# Patient Record
Sex: Male | Born: 1970 | Race: White | Hispanic: No | Marital: Married | Smoking: Never smoker
Health system: Southern US, Community
[De-identification: ages and names within clinical notes are randomized; demographics above are authoritative.]

## PROBLEM LIST (undated history)

## (undated) DIAGNOSIS — E7800 Pure hypercholesterolemia, unspecified: Secondary | ICD-10-CM

## (undated) HISTORY — PX: APPENDECTOMY: SHX54

---

## 2016-12-19 ENCOUNTER — Inpatient Hospital Stay (HOSPITAL_COMMUNITY)
Admission: EM | Admit: 2016-12-19 | Discharge: 2016-12-22 | DRG: 390 | Disposition: A | Discharge status: Home / Self Care | Payer: Managed Care, Other (non HMO) | Attending: Surgery | Admitting: Surgery

## 2016-12-19 ENCOUNTER — Encounter (HOSPITAL_COMMUNITY): Payer: Self-pay | Admitting: Emergency Medicine

## 2016-12-19 ENCOUNTER — Emergency Department (HOSPITAL_COMMUNITY): Payer: Managed Care, Other (non HMO)

## 2016-12-19 DIAGNOSIS — Z9049 Acquired absence of other specified parts of digestive tract: Secondary | ICD-10-CM

## 2016-12-19 DIAGNOSIS — K565 Intestinal adhesions [bands], unspecified as to partial versus complete obstruction: Secondary | ICD-10-CM | POA: Diagnosis not present

## 2016-12-19 DIAGNOSIS — K56609 Unspecified intestinal obstruction, unspecified as to partial versus complete obstruction: Secondary | ICD-10-CM

## 2016-12-19 DIAGNOSIS — Z0189 Encounter for other specified special examinations: Secondary | ICD-10-CM

## 2016-12-19 HISTORY — DX: Pure hypercholesterolemia, unspecified: E78.00

## 2016-12-19 LAB — COMPREHENSIVE METABOLIC PANEL
ALK PHOS: 68 U/L (ref 38–126)
ALT: 18 U/L (ref 17–63)
AST: 26 U/L (ref 15–41)
Albumin: 4.2 g/dL (ref 3.5–5.0)
Anion gap: 11 (ref 5–15)
BILIRUBIN TOTAL: 0.8 mg/dL (ref 0.3–1.2)
BUN: 16 mg/dL (ref 6–20)
CALCIUM: 8.8 mg/dL — AB (ref 8.9–10.3)
CO2: 22 mmol/L (ref 22–32)
CREATININE: 1.01 mg/dL (ref 0.61–1.24)
Chloride: 106 mmol/L (ref 101–111)
GFR calc non Af Amer: >60 mL/min (ref >60)
GLUCOSE: 174 mg/dL — AB (ref 65–99)
Potassium: 4.1 mmol/L (ref 3.5–5.1)
SODIUM: 139 mmol/L (ref 135–145)
TOTAL PROTEIN: 7.1 g/dL (ref 6.5–8.1)

## 2016-12-19 LAB — CBC
HCT: 39.8 % (ref 39.0–52.0)
Hemoglobin: 14.7 g/dL (ref 13.0–17.0)
MCH: 30.7 pg (ref 26.0–34.0)
MCHC: 36.9 g/dL — AB (ref 30.0–36.0)
MCV: 83.1 fL (ref 78.0–100.0)
PLATELETS: 186 10*3/uL (ref 150–400)
RBC: 4.79 MIL/uL (ref 4.22–5.81)
RDW: 12.4 % (ref 11.5–15.5)
WBC: 12.2 10*3/uL — ABNORMAL HIGH (ref 4.0–10.5)

## 2016-12-19 LAB — URINALYSIS, ROUTINE W REFLEX MICROSCOPIC
BILIRUBIN URINE: NEGATIVE
Glucose, UA: 50 mg/dL — AB
HGB URINE DIPSTICK: NEGATIVE
Ketones, ur: 20 mg/dL — AB
Leukocytes, UA: NEGATIVE
Nitrite: NEGATIVE
PROTEIN: NEGATIVE mg/dL
Specific Gravity, Urine: 1.036 — ABNORMAL HIGH (ref 1.005–1.030)
pH: 7 (ref 5.0–8.0)

## 2016-12-19 LAB — I-STAT CG4 LACTIC ACID, ED: LACTIC ACID, VENOUS: 2.22 mmol/L — AB (ref 0.5–1.9)

## 2016-12-19 LAB — LIPASE, BLOOD: Lipase: 27 U/L (ref 11–51)

## 2016-12-19 MED ORDER — HYDROMORPHONE HCL 1 MG/ML IJ SOLN
1.0000 mg | Freq: Once | INTRAMUSCULAR | Status: AC
  Administered 2016-12-19: 1 mg via INTRAVENOUS
  Filled 2016-12-19: qty 1

## 2016-12-19 MED ORDER — HEPARIN SODIUM (PORCINE) 5000 UNIT/ML IJ SOLN
5000.0000 [IU] | Freq: Three times a day (TID) | INTRAMUSCULAR | Status: DC
Start: 2016-12-19 — End: 2016-12-22
  Administered 2016-12-19 – 2016-12-22 (×8): 5000 [IU] via SUBCUTANEOUS
  Filled 2016-12-19 (×8): qty 1

## 2016-12-19 MED ORDER — SODIUM CHLORIDE 0.9 % IV BOLUS (SEPSIS)
1000.0000 mL | Freq: Once | INTRAVENOUS | Status: AC
  Administered 2016-12-19: 1000 mL via INTRAVENOUS

## 2016-12-19 MED ORDER — MORPHINE SULFATE (PF) 2 MG/ML IV SOLN
1.0000 mg | INTRAVENOUS | Status: DC | PRN
  Administered 2016-12-20: 1 mg via INTRAVENOUS
  Filled 2016-12-19: qty 1

## 2016-12-19 MED ORDER — ONDANSETRON 4 MG PO TBDP: 4.0000 mg | ORAL_TABLET | Freq: Four times a day (QID) | ORAL | Status: DC | PRN

## 2016-12-19 MED ORDER — HYDRALAZINE HCL 20 MG/ML IJ SOLN: 10.0000 mg | INTRAMUSCULAR | Status: DC | PRN

## 2016-12-19 MED ORDER — ONDANSETRON HCL 4 MG/2ML IJ SOLN
4.0000 mg | Freq: Four times a day (QID) | INTRAMUSCULAR | Status: DC | PRN
  Administered 2016-12-19 – 2016-12-20 (×2): 4 mg via INTRAVENOUS
  Filled 2016-12-19 (×2): qty 2

## 2016-12-19 MED ORDER — DIPHENHYDRAMINE HCL 25 MG PO CAPS: 25.0000 mg | ORAL_CAPSULE | Freq: Four times a day (QID) | ORAL | Status: DC | PRN

## 2016-12-19 MED ORDER — MORPHINE SULFATE (PF) 4 MG/ML IV SOLN
8.0000 mg | Freq: Once | INTRAVENOUS | Status: AC
  Administered 2016-12-19: 8 mg via INTRAVENOUS
  Filled 2016-12-19: qty 2

## 2016-12-19 MED ORDER — ONDANSETRON HCL 4 MG/2ML IJ SOLN
4.0000 mg | Freq: Once | INTRAMUSCULAR | Status: AC
  Administered 2016-12-19: 4 mg via INTRAVENOUS
  Filled 2016-12-19: qty 2

## 2016-12-19 MED ORDER — PANTOPRAZOLE SODIUM 40 MG IV SOLR
40.0000 mg | Freq: Every day | INTRAVENOUS | Status: DC
  Administered 2016-12-19 – 2016-12-21 (×3): 40 mg via INTRAVENOUS
  Filled 2016-12-19 (×3): qty 40

## 2016-12-19 MED ORDER — IOPAMIDOL (ISOVUE-300) INJECTION 61%
100.0000 mL | Freq: Once | INTRAVENOUS | Status: AC | PRN
  Administered 2016-12-19: 100 mL via INTRAVENOUS

## 2016-12-19 MED ORDER — KCL IN DEXTROSE-NACL 20-5-0.45 MEQ/L-%-% IV SOLN
INTRAVENOUS | Status: DC
  Administered 2016-12-19: 1000 mL via INTRAVENOUS
  Administered 2016-12-20 – 2016-12-21 (×3): via INTRAVENOUS
  Filled 2016-12-19 (×6): qty 1000

## 2016-12-19 MED ORDER — IOPAMIDOL (ISOVUE-300) INJECTION 61%
INTRAVENOUS | Status: AC
  Filled 2016-12-19: qty 100

## 2016-12-19 MED ORDER — DIPHENHYDRAMINE HCL 50 MG/ML IJ SOLN: 25.0000 mg | Freq: Four times a day (QID) | INTRAMUSCULAR | Status: DC | PRN

## 2016-12-19 NOTE — ED Notes (Signed)
Attempted to call report to floor, nurse is tied up and will call back.

## 2016-12-19 NOTE — ED Notes (Signed)
Bed: WA15 Expected date:  Expected time:  Means of arrival:  Comments: EMS-abdominal pain 

## 2016-12-19 NOTE — ED Notes (Signed)
Patient's wife in hallway stating that patient is having severe pain. Explained to wife that waiting for EDP to give orders for patient.  Went to EDP office and made announcement to the ED providers in the office at this time, due to no provider currently assigned to patient, that patient LLQ pain is getting worse and requesting pain medications.   No new orders given at this time.

## 2016-12-19 NOTE — H&P (Signed)
Chief Complaint:  Acute onset of abdominal pain  History of Present Illness:  Ronald Frazier is an 46 y.o. male who was brought to the [facility] ER from work with sudden onset of abdominal pain and vomiting.  He lives in [place] and is followed by primary doc Eliezer Mccoy.  He works in [employer].  The pain would wax and wane and was severe and associated with sweats.  His only operation was an open appendectomy 40 years ago.    Presently he is sore in the abdomen.  He is feeling better  Past Medical History:  Diagnosis Date  . High cholesterol     Past Surgical History:  Procedure Laterality Date  . APPENDECTOMY      Current Facility-Administered Medications  Medication Dose Route Frequency Provider Last Rate Last Dose  . iopamidol (ISOVUE-300) 61 % injection           . morphine 4 MG/ML injection 8 mg  8 mg Intravenous Once Sherwood Gambler, MD       Current Outpatient Prescriptions  Medication Sig Dispense Refill  . fexofenadine (ALLEGRA) 30 MG tablet Take 30 mg by mouth daily as needed (allergies).    Marland Kitchen ibuprofen (ADVIL,MOTRIN) 200 MG tablet Take 200 mg by mouth every 6 (six) hours as needed for moderate pain.     Patient has no known allergies. History reviewed. No pertinent family history. Social History:   reports that he has never smoked. He has never used smokeless tobacco. He reports that he does not drink alcohol or use drugs.   REVIEW OF SYSTEMS : Negative except for a hx of recurrent left lower quadrant pain  Physical Exam:   Blood pressure (!) 139/91, pulse 60, temperature 97.7 F (36.5 C), temperature source Oral, resp. rate 18, height 6' (1.829 m), weight 95.3 kg (210 lb), SpO2 100 %. Body mass index is 28.48 kg/m.  Gen:  WDWN WM NAD  Neurological: Alert and oriented to person, place, and time. Motor and sensory function is grossly intact  Head: Normocephalic and atraumatic.  Eyes: Conjunctivae are normal. Pupils are equal, round, and reactive to light. No scleral  icterus.  Neck: Normal range of motion. Neck supple. No tracheal deviation or thyromegaly present.  Cardiovascular:  SR without murmurs or gallops.  No carotid bruits Breast:  Not examined Respiratory: Effort normal.  No respiratory distress. No chest wall tenderness. Breath sounds normal.  No wheezes, rales or rhonchi.  Abdomen:  Flat and hairy; soft with some tenderness in the left lower quadrant.  No peritoneal signs. Patient had been medicated in the recent past GU:  No hernia Musculoskeletal: Normal range of motion. Extremities are nontender. No cyanosis, edema or clubbing noted Lymphadenopathy: No cervical, preauricular, postauricular or axillary adenopathy is present Skin: Skin is warm and dry. No rash noted. No diaphoresis. No erythema. No pallor. Pscyh: Normal mood and affect. Behavior is normal. Judgment and thought content normal.   LABORATORY RESULTS: Results for orders placed or performed during the hospital encounter of 12/19/16 (from the past 48 hour(s))  Lipase, blood     Status: None   Collection Time: 12/19/16  5:35 PM  Result Value Ref Range   Lipase 27 11 - 51 U/L  Comprehensive metabolic panel     Status: Abnormal   Collection Time: 12/19/16  5:35 PM  Result Value Ref Range   Sodium 139 135 - 145 mmol/L   Potassium 4.1 3.5 - 5.1 mmol/L   Chloride 106 101 - 111 mmol/L  CO2 22 22 - 32 mmol/L   Glucose, Bld 174 (H) 65 - 99 mg/dL   BUN 16 6 - 20 mg/dL   Creatinine, Ser 1.01 0.61 - 1.24 mg/dL   Calcium 8.8 (L) 8.9 - 10.3 mg/dL   Total Protein 7.1 6.5 - 8.1 g/dL   Albumin 4.2 3.5 - 5.0 g/dL   AST 26 15 - 41 U/L   ALT 18 17 - 63 U/L   Alkaline Phosphatase 68 38 - 126 U/L   Total Bilirubin 0.8 0.3 - 1.2 mg/dL   GFR calc non Af Amer >60 >60 mL/min   GFR calc Af Amer >60 >60 mL/min    Comment: (NOTE) The eGFR has been calculated using the CKD EPI equation. This calculation has not been validated in all clinical situations. eGFR's persistently <60 mL/min signify  possible Chronic Kidney Disease.    Anion gap 11 5 - 15  CBC     Status: Abnormal   Collection Time: 12/19/16  5:35 PM  Result Value Ref Range   WBC 12.2 (H) 4.0 - 10.5 K/uL   RBC 4.79 4.22 - 5.81 MIL/uL   Hemoglobin 14.7 13.0 - 17.0 g/dL   HCT 39.8 39.0 - 52.0 %   MCV 83.1 78.0 - 100.0 fL   MCH 30.7 26.0 - 34.0 pg   MCHC 36.9 (H) 30.0 - 36.0 g/dL   RDW 12.4 11.5 - 15.5 %   Platelets 186 150 - 400 K/uL  I-Stat CG4 Lactic Acid, ED     Status: Abnormal   Collection Time: 12/19/16  8:44 PM  Result Value Ref Range   Lactic Acid, Venous 2.22 (HH) 0.5 - 1.9 mmol/L   Comment NOTIFIED PHYSICIAN      RADIOLOGY RESULTS: Ct Abdomen Pelvis W Contrast  Result Date: 12/19/2016 CLINICAL DATA:  Left lower quadrant abdominal pain, nausea and vomiting. EXAM: CT ABDOMEN AND PELVIS WITH CONTRAST TECHNIQUE: Multidetector CT imaging of the abdomen and pelvis was performed using the standard protocol following bolus administration of intravenous contrast. CONTRAST:  173m ISOVUE-300 IOPAMIDOL (ISOVUE-300) INJECTION 61% COMPARISON:  None. FINDINGS: Lower chest: Tiny inferior right upper lobe calcified granuloma on image number 1 of series 3. There is also a tiny calcified granuloma in the medial aspect of the right lower lobe on image number 22 of series 3. Hepatobiliary: No focal liver abnormality is seen. No gallstones, gallbladder wall thickening, or biliary dilatation. Pancreas: Unremarkable. No pancreatic ductal dilatation or surrounding inflammatory changes. Spleen: Normal in size without focal abnormality. Adrenals/Urinary Tract: Prominent extrarenal pelvis on the left. Otherwise, normal appearing kidneys, ureters and urinary bladder. Normal appearing adrenal glands. Stomach/Bowel: Multiple dilated small bowel loops within and proximal to an area of swirling mesenteric with mesenteric edema. There are areas of focal narrowing of these loops with a pinched appearance. No bowel wall thickening or pneumatosis  demonstrated. No free peritoneal air. There is a small amount of free peritoneal fluid. Vascular/Lymphatic: Swirling mesenteric vessels and fat in the left lower abdomen in the area of dilated bowel loops. No areas of vascular occlusion are seen. No aneurysm. No enlarged lymph nodes. Reproductive: Mildly enlarged prostate gland with central calcification. Other: Small amount of free peritoneal fluid. Musculoskeletal: Mild lumbar and lower thoracic spine degenerative changes, greatest at the L4-5 level. IMPRESSION: Closed loop small-bowel obstruction in the left lower abdomen with mesenteric edema and swirling. This is compatible with an internal hernia or twisting of the mesentery. No evidence of intestinal ischemia at this time. There is an  associated small amount of free peritoneal fluid. These results were called by telephone at the time of interpretation on 12/19/2016 at 7:58 pm to Dr. Sherwood Gambler , who verbally acknowledged these results. Electronically Signed   By: Claudie Revering M.D.   On: 12/19/2016 20:00    Problem List: Patient Active Problem List   Diagnosis Date Noted  . Small bowel obstruction due to adhesions ([place]) 12/19/2016  . Hx of appendectomy 12/19/2016    Assessment & Plan: I reviewed the CT scan and there may be a mechanical twist in the left lower quadrant.  Presently he is feeling better.  Will admit for hydration and observation.  Repeat lab and xray in the am.  If recurrent vomiting, will place NG tube.      Matt B. Hassell Done, MD, [facility] [phone] beeper [phone]  12/19/2016 8:59 PM

## 2016-12-19 NOTE — ED Notes (Signed)
ED Provider at bedside. 

## 2016-12-19 NOTE — ED Provider Notes (Signed)
[facility] Provider Note   CSN: [id number] Arrival date & time: 12/19/16  1715     History   Chief Complaint Chief Complaint  Patient presents with  . Abdominal Pain  . Nausea  . Emesis    HPI Ronald Frazier is a 46 y.o. male.  HPI  46 year old male presents with severe abdominal pain. He states that around 1 PM he started having gradually progressive abdominal pain and vomiting. He has had one loose stool without blood. He estimates about 5 episodes of vomiting. None of these have had blood either. Abdominal pain is diffuse but it does feel like is probably worse in the left lower quadrant. It feels like a dull ache. He denies any recent antibiotics. No fevers. He wonders if this is food poisoning as this happened about an hour after lunch. He made himself appear butter and jelly sandwich. No sick contacts. No urinary symptoms.  Past Medical History:  Diagnosis Date  . High cholesterol     Patient Active Problem List   Diagnosis Date Noted  . Small bowel obstruction due to adhesions ([facility]) 12/19/2016  . Hx of appendectomy 12/19/2016    Past Surgical History:  Procedure Laterality Date  . APPENDECTOMY         Home Medications    Prior to Admission medications   Medication Sig Start Date End Date Taking? Authorizing Provider  fexofenadine (ALLEGRA) 30 MG tablet Take 30 mg by mouth daily as needed (allergies).   Yes [provider]  ibuprofen (ADVIL,MOTRIN) 200 MG tablet Take 200 mg by mouth every 6 (six) hours as needed for moderate pain.   Yes [provider]    Family History History reviewed. No pertinent family history.  Social History Social History  Substance Use Topics  . Smoking status: Never Smoker  . Smokeless tobacco: Never Used  . Alcohol use No     Allergies   Patient has no known allergies.   Review of Systems Review of Systems  Constitutional: Negative for fever.  Gastrointestinal: Positive for  abdominal pain, diarrhea, nausea and vomiting. Negative for blood in stool.  Genitourinary: Negative for dysuria and hematuria.  Musculoskeletal: Negative for back pain.  All other systems reviewed and are negative.    Physical Exam Updated Vital Signs BP 140/85 (BP Location: Right Arm)   Pulse 78   Temp 97.7 F (36.5 C) (Oral)   Resp 18   Ht 6' (1.829 m)   Wt 95.3 kg (210 lb)   SpO2 99%   BMI 28.48 kg/m   Physical Exam  Constitutional: He is oriented to person, place, and time. He appears well-developed and well-nourished.  HENT:  Head: Normocephalic and atraumatic.  Right Ear: External ear normal.  Left Ear: External ear normal.  Nose: Nose normal.  Mouth/Throat: Mucous membranes are dry.  Eyes: Right eye exhibits no discharge. Left eye exhibits no discharge.  Neck: Neck supple.  Cardiovascular: Normal rate, regular rhythm and normal heart sounds.   Pulmonary/Chest: Effort normal and breath sounds normal.  Abdominal: Soft. There is tenderness in the left upper quadrant and left lower quadrant. There is no rigidity.  Musculoskeletal: He exhibits no edema.  Neurological: He is alert and oriented to person, place, and time.  Skin: Skin is warm and dry.  Nursing note and vitals reviewed.    ED Treatments / Results  Labs (all labs ordered are listed, but only abnormal results are displayed) Labs Reviewed  COMPREHENSIVE METABOLIC PANEL - Abnormal; Notable for  the following:       Result Value   Glucose, Bld 174 (*)    Calcium 8.8 (*)    All other components within normal limits  CBC - Abnormal; Notable for the following:    WBC 12.2 (*)    MCHC 36.9 (*)    All other components within normal limits  URINALYSIS, ROUTINE W REFLEX MICROSCOPIC - Abnormal; Notable for the following:    Specific Gravity, Urine 1.036 (*)    Glucose, UA 50 (*)    Ketones, ur 20 (*)    All other components within normal limits  I-STAT CG4 LACTIC ACID, ED - Abnormal; Notable for the  following:    Lactic Acid, Venous 2.22 (*)    All other components within normal limits  LIPASE, BLOOD  HIV ANTIBODY (ROUTINE TESTING)  BASIC METABOLIC PANEL    EKG  EKG Interpretation None       Radiology Ct Abdomen Pelvis W Contrast  Result Date: 12/19/2016 CLINICAL DATA:  Left lower quadrant abdominal pain, nausea and vomiting. EXAM: CT ABDOMEN AND PELVIS WITH CONTRAST TECHNIQUE: Multidetector CT imaging of the abdomen and pelvis was performed using the standard protocol following bolus administration of intravenous contrast. CONTRAST:  ISOVUE-300 IOPAMIDOL (ISOVUE-300) INJECTION 61% COMPARISON:  None. FINDINGS: Lower chest: Tiny inferior right upper lobe calcified granuloma on image number 1 of series 3. There is also a tiny calcified granuloma in the medial aspect of the right lower lobe on image number 22 of series 3. Hepatobiliary: No focal liver abnormality is seen. No gallstones, gallbladder wall thickening, or biliary dilatation. Pancreas: Unremarkable. No pancreatic ductal dilatation or surrounding inflammatory changes. Spleen: Normal in size without focal abnormality. Adrenals/Urinary Tract: Prominent extrarenal pelvis on the left. Otherwise, normal appearing kidneys, ureters and urinary bladder. Normal appearing adrenal glands. Stomach/Bowel: Multiple dilated small bowel loops within and proximal to an area of swirling mesenteric with mesenteric edema. There are areas of focal narrowing of these loops with a pinched appearance. No bowel wall thickening or pneumatosis demonstrated. No free peritoneal air. There is a small amount of free peritoneal fluid. Vascular/Lymphatic: Swirling mesenteric vessels and fat in the left lower abdomen in the area of dilated bowel loops. No areas of vascular occlusion are seen. No aneurysm. No enlarged lymph nodes. Reproductive: Mildly enlarged prostate gland with central calcification. Other: Small amount of free peritoneal fluid. Musculoskeletal:  Mild lumbar and lower thoracic spine degenerative changes, greatest at the L4-5 level. IMPRESSION: Closed loop small-bowel obstruction in the left lower abdomen with mesenteric edema and swirling. This is compatible with an internal hernia or twisting of the mesentery. No evidence of intestinal ischemia at this time. There is an associated small amount of free peritoneal fluid. These results were called by telephone at the time of interpretation on 12/19/2016 at 7:58 pm to Dr. Pricilla Loveless , who verbally acknowledged these results. Electronically Signed   By: Beckie Salts M.D.   On: 12/19/2016 20:00    Procedures Procedures (including critical care time)  Medications Ordered in ED Medications  iopamidol (ISOVUE-300) 61 % injection (not administered)  heparin injection 5,000 Units (5,000 Units Subcutaneous Given 12/19/16 2321)  dextrose 5 % and 0.45 % NaCl with KCl 20 mEq/L infusion (1,000 mLs Intravenous New Bag/Given 12/19/16 2320)  morphine 2 MG/ML injection 1 mg (not administered)  diphenhydrAMINE (BENADRYL) capsule 25 mg (not administered)    Or  diphenhydrAMINE (BENADRYL) injection 25 mg (not administered)  ondansetron (ZOFRAN-ODT) disintegrating tablet 4 mg ( Oral  See Alternative 12/19/16 2322)    Or  ondansetron [facility](ZOFRAN) injection 4 mg (4 mg Intravenous Given 12/19/16 2322)  pantoprazole (PROTONIX) injection 40 mg (40 mg Intravenous Given 12/19/16 2321)  hydrALAZINE (APRESOLINE) injection 10 mg (not administered)  sodium chloride 0.9 % bolus 1,000 mL (0 mLs Intravenous Stopped 12/19/16 2059)  HYDROmorphone (DILAUDID) injection 1 mg (1 mg Intravenous Given 12/19/16 1844)  ondansetron (ZOFRAN) injection 4 mg (4 mg Intravenous Given 12/19/16 1844)  sodium chloride 0.9 % bolus 1,000 mL (0 mLs Intravenous Stopped 12/19/16 2100)  iopamidol (ISOVUE-300) 61 % injection 100 mL (100 mLs Intravenous Contrast Given 12/19/16 1931)  morphine 4 MG/ML injection 8 mg (8 mg Intravenous Given 12/19/16 2100)      Initial Impression / Assessment and Plan / ED Course  I have reviewed the triage vital signs and the nursing notes.  Pertinent labs & imaging results that were available during my care of the patient were reviewed by me and considered in my medical decision making (see chart for details).     Given degree of pain and tenderness, CT obtained, shows closed loop obstruction. Pain somewhat improved but still present. No vomiting in ED after zofran. Concern for possible internal hernia, thus surgery consulted. They will admit, fluids, npo. No NG at this time per Dr. Daphine DeutscherMartin.   Final Clinical Impressions(s) / ED Diagnoses   Final diagnoses:  Small bowel obstruction [facility](HCC)    New Prescriptions Current Discharge Medication List       Pricilla LovelessGoldston, Mela Perham, MD 12/20/16 [phone]

## 2016-12-19 NOTE — ED Notes (Signed)
Writer informed pt that a urine is needed for sample, urinal at bedside.

## 2016-12-19 NOTE — ED Triage Notes (Addendum)
Patient comes in with [facility] from work c/o LLQ pain with n/v and 1 soft stool that started gradually while patient was at work but denies diarrhea and urinary problems. .  Patient was given Zofran 4mg  and two doses of Fentanyl 50mcg and NS 500ml via IV in route.  Patient dry heaving upon starting triaging this patient. BP initially was 90/60, then systolic came up to 130s.

## 2016-12-20 ENCOUNTER — Observation Stay (HOSPITAL_COMMUNITY): Payer: Managed Care, Other (non HMO)

## 2016-12-20 ENCOUNTER — Inpatient Hospital Stay (HOSPITAL_COMMUNITY): Payer: Managed Care, Other (non HMO)

## 2016-12-20 DIAGNOSIS — Z9049 Acquired absence of other specified parts of digestive tract: Secondary | ICD-10-CM | POA: Diagnosis present

## 2016-12-20 DIAGNOSIS — K565 Intestinal adhesions [bands], unspecified as to partial versus complete obstruction: Secondary | ICD-10-CM | POA: Diagnosis present

## 2016-12-20 LAB — BASIC METABOLIC PANEL
Anion gap: 7 (ref 5–15)
BUN: 11 mg/dL (ref 6–20)
CALCIUM: 8.5 mg/dL — AB (ref 8.9–10.3)
CO2: 25 mmol/L (ref 22–32)
Chloride: 107 mmol/L (ref 101–111)
Creatinine, Ser: 0.86 mg/dL (ref 0.61–1.24)
GFR calc Af Amer: >60 mL/min (ref >60)
GLUCOSE: 128 mg/dL — AB (ref 65–99)
POTASSIUM: 4.2 mmol/L (ref 3.5–5.1)
SODIUM: 139 mmol/L (ref 135–145)

## 2016-12-20 LAB — CBC
HCT: 38.9 % — ABNORMAL LOW (ref 39.0–52.0)
Hemoglobin: 14 g/dL (ref 13.0–17.0)
MCH: 30.3 pg (ref 26.0–34.0)
MCHC: 36 g/dL (ref 30.0–36.0)
MCV: 84.2 fL (ref 78.0–100.0)
Platelets: 181 10*3/uL (ref 150–400)
RBC: 4.62 MIL/uL (ref 4.22–5.81)
RDW: 12.7 % (ref 11.5–15.5)
WBC: 10.3 10*3/uL (ref 4.0–10.5)

## 2016-12-20 LAB — LACTIC ACID, PLASMA: LACTIC ACID, VENOUS: 1.7 mmol/L (ref 0.5–1.9)

## 2016-12-20 MED ORDER — DIATRIZOATE MEGLUMINE & SODIUM 66-10 % PO SOLN
90.0000 mL | Freq: Once | ORAL | Status: AC
  Administered 2016-12-20: 90 mL via NASOGASTRIC
  Filled 2016-12-20: qty 90

## 2016-12-20 MED ORDER — PHENOL 1.4 % MT LIQD
1.0000 | OROMUCOSAL | Status: DC | PRN
  Filled 2016-12-20: qty 177

## 2016-12-20 MED ORDER — ACETAMINOPHEN 10 MG/ML IV SOLN
1000.0000 mg | Freq: Four times a day (QID) | INTRAVENOUS | Status: AC | PRN
  Administered 2016-12-20: 1000 mg via INTRAVENOUS
  Filled 2016-12-20: qty 100

## 2016-12-20 NOTE — Progress Notes (Signed)
[facility] Progress Note     Subjective: CC:  Pt reports abdominal pain yesterday, intermittent, severe, cramping, followed by multiple episodes of emesis. Denies similar pain in past. history of open appendectomy over 9 years ago. No person HX hernia or cancer, mother had GYN cancer, father had pancreatic cancer.  Abdominal pain improving but still mildly tender. Last episode vomiting around 9-10 PM. One episode flatus. No BM. Last BM yesterday AM and was loose. At baseline patient has one, formed stool daily.   Objective: Vital signs in last 24 hours: Temp:  [97.7 F (36.5 C)-98.2 F (36.8 C)] 98.2 F (36.8 C) (07/17 0451) Pulse Rate:  [60-83] 68 (07/17 0451) Resp:  [16-19] 16 (07/17 0451) BP: (115-140)/(64-91) 115/64 (07/17 0451) SpO2:  [93 %-100 %] 100 % (07/17 0451) Weight:  [95.3 kg (210 lb)] 95.3 kg (210 lb) (07/16 1727) Last BM Date: 12/19/16  Intake/Output from previous day: 07/16 0701 - 07/17 0700 In: 2666.7 [I.V.:666.7; IV Piggyback:2000] Out: 575 [Urine:575] Intake/Output this shift: Total I/O In: -  Out: 675 [Urine:675]  PE: Gen:  Alert, NAD, cooperative HEENT: pupils equal and round, EOM's in tact Card:  Regular rate and rhythm, pedal pulses 2+ BL Pulm:  Normal effort, clear to auscultation bilaterally Abd: Soft, TTP epigastrium and LLQ without rebound or guarding, mild distention, hypoactive BSSkin: warm and dry, no rashes  Psych: A&Ox3   Lab Results:   Recent Labs  12/19/16 1735  WBC 12.2*  HGB 14.7  HCT 39.8  PLT 186   BMET  Recent Labs  12/19/16 1735 12/20/16 0535  NA 139 139  K 4.1 4.2  CL 106 107  CO2 22 25  GLUCOSE 174* 128*  BUN 16 11  CREATININE 1.01 0.86  CALCIUM 8.8* 8.5*   PT/INR No results for input(s): LABPROT, INR in the last 72 hours. CMP     Component Value Date/Time   NA 139 12/20/2016 0535   K 4.2 12/20/2016 0535   CL 107 12/20/2016 0535   CO2 25 12/20/2016 0535   GLUCOSE 128 (H) 12/20/2016 0535    BUN 11 12/20/2016 0535   CREATININE 0.86 12/20/2016 0535   CALCIUM 8.5 (L) 12/20/2016 0535   PROT 7.1 12/19/2016 1735   ALBUMIN 4.2 12/19/2016 1735   AST 26 12/19/2016 1735   ALT 18 12/19/2016 1735   ALKPHOS 68 12/19/2016 1735   BILITOT 0.8 12/19/2016 1735   GFRNONAA >60 12/20/2016 0535   GFRAA >60 12/20/2016 0535   Lipase     Component Value Date/Time   LIPASE 27 12/19/2016 1735       Studies/Results: Ct Abdomen Pelvis W Contrast  Result Date: 12/19/2016 CLINICAL DATA:  Left lower quadrant abdominal pain, nausea and vomiting. EXAM: CT ABDOMEN AND PELVIS WITH CONTRAST TECHNIQUE: Multidetector CT imaging of the abdomen and pelvis was performed using the standard protocol following bolus administration of intravenous contrast. CONTRAST:  100mL ISOVUE-300 IOPAMIDOL (ISOVUE-300) INJECTION 61% COMPARISON:  None. FINDINGS: Lower chest: Tiny inferior right upper lobe calcified granuloma on image number 1 of series 3. There is also a tiny calcified granuloma in the medial aspect of the right lower lobe on image number 22 of series 3. Hepatobiliary: No focal liver abnormality is seen. No gallstones, gallbladder wall thickening, or biliary dilatation. Pancreas: Unremarkable. No pancreatic ductal dilatation or surrounding inflammatory changes. Spleen: Normal in size without focal abnormality. Adrenals/Urinary Tract: Prominent extrarenal pelvis on the left. Otherwise, normal appearing kidneys, ureters and urinary bladder. Normal appearing adrenal glands. Stomach/Bowel:  Multiple dilated small bowel loops within and proximal to an area of swirling mesenteric with mesenteric edema. There are areas of focal narrowing of these loops with a pinched appearance. No bowel wall thickening or pneumatosis demonstrated. No free peritoneal air. There is a small amount of free peritoneal fluid. Vascular/Lymphatic: Swirling mesenteric vessels and fat in the left lower abdomen in the area of dilated bowel loops. No areas  of vascular occlusion are seen. No aneurysm. No enlarged lymph nodes. Reproductive: Mildly enlarged prostate gland with central calcification. Other: Small amount of free peritoneal fluid. Musculoskeletal: Mild lumbar and lower thoracic spine degenerative changes, greatest at the L4-5 level. IMPRESSION: Closed loop small-bowel obstruction in the left lower abdomen with mesenteric edema and swirling. This is compatible with an internal hernia or twisting of the mesentery. No evidence of intestinal ischemia at this time. There is an associated small amount of free peritoneal fluid. These results were called by telephone at the time of interpretation on 12/19/2016 at 7:58 pm to Dr. Pricilla Loveless , who verbally acknowledged these results. Electronically Signed   By: Beckie Salts M.D.   On: 12/19/2016 20:00    Anti-infectives: Anti-infectives    None     Assessment/Plan SBO - Past medical history of open appendectomy - CT scan performed 7/16 suggests mesenteric swirling in the left lower quadrant with proximally dilated loops of small bowel, repeat AXR this AM with worsening small bowel distention in mid abdomen (5.3 cm) - lactate yesterday 2.2, WBC 12.2; repeat labs pending -  Place NG tube and start small bowel protocol.  FEN - nothing by mouth, IVF, electrolytes within normal limits ID- none; WBC 12.2 on 7/16 VT-Lovenox, SCDs   LOS: 0 days    Adam Phenix , [facility] 12/20/2016, 8:45 AM Pager: [phone] Consults: [phone] Mon-Fri 7:00 am-4:30 pm Sat-Sun 7:00 am-11:30 am

## 2016-12-20 NOTE — Progress Notes (Signed)
Dr. Gerrit FriendsGerkin aware via phone pt has had lingering headache throughout shift. Morphine ineffective. See new order received.

## 2016-12-21 LAB — HIV ANTIBODY (ROUTINE TESTING W REFLEX): HIV Screen 4th Generation wRfx: NONREACTIVE

## 2016-12-21 MED ORDER — POLYETHYLENE GLYCOL 3350 17 G PO PACK: 17.0000 g | PACK | Freq: Every day | ORAL | Status: DC | PRN

## 2016-12-21 MED ORDER — ACETAMINOPHEN 500 MG PO TABS: 500.0000 mg | ORAL_TABLET | ORAL | Status: DC | PRN

## 2016-12-21 NOTE — Progress Notes (Signed)
[name] PA paged patient requesting to go home.Ronald Frazier. D Arth Nicastro Rn

## 2016-12-21 NOTE — Progress Notes (Signed)
[facility] Progress Note     Subjective: CC:  No abdominal pain or nausea. Having flatus. Denies BM. Did not mobilize much yesterday 2/2 to being connected to wall suction.   AXR shows gastrografin contrast throughout the colon.  Objective: Vital signs in last 24 hours: Temp:  [98.1 F (36.7 C)-99 F (37.2 C)] 98.1 F (36.7 C) (07/18 0459) Pulse Rate:  [72-79] 72 (07/18 0459) Resp:  [18] 18 (07/18 0459) BP: (146-155)/(82-92) 151/92 (07/18 0459) SpO2:  [98 %-100 %] 99 % (07/18 0459) Last BM Date: 12/19/16  Intake/Output from previous day: 07/17 0701 - 07/18 0700 In: 2500 [I.V.:2400; IV Piggyback:100] Out: 4825 [Urine:3075; Emesis/NG output:1750] Intake/Output this shift: Total I/O In: -  Out: 200 [Emesis/NG output:200]  PE: Gen:  Alert, NAD, cooperative HEENT: pupils equal and round, EOM's in tact Card:  Regular rate and rhythm, pedal pulses 2+ BL Pulm:  Normal effort, clear to auscultation bilaterally Abd: Soft, non-tender, ND , +BS Skin: warm and dry, no rashes  Psych: A&Ox3   Lab Results:   Recent Labs  12/19/16 1735 12/20/16 1016  WBC 12.2* 10.3  HGB 14.7 14.0  HCT 39.8 38.9*  PLT 186 181   BMET  Recent Labs  12/19/16 1735 12/20/16 0535  NA 139 139  K 4.1 4.2  CL 106 107  CO2 22 25  GLUCOSE 174* 128*  BUN 16 11  CREATININE 1.01 0.86  CALCIUM 8.8* 8.5*   PT/INR No results for input(s): LABPROT, INR in the last 72 hours. CMP     Component Value Date/Time   NA 139 12/20/2016 0535   K 4.2 12/20/2016 0535   CL 107 12/20/2016 0535   CO2 25 12/20/2016 0535   GLUCOSE 128 (H) 12/20/2016 0535   BUN 11 12/20/2016 0535   CREATININE 0.86 12/20/2016 0535   CALCIUM 8.5 (L) 12/20/2016 0535   PROT 7.1 12/19/2016 1735   ALBUMIN 4.2 12/19/2016 1735   AST 26 12/19/2016 1735   ALT 18 12/19/2016 1735   ALKPHOS 68 12/19/2016 1735   BILITOT 0.8 12/19/2016 1735   GFRNONAA >60 12/20/2016 0535   GFRAA >60 12/20/2016 0535   Lipase      Component Value Date/Time   LIPASE 27 12/19/2016 1735       Studies/Results: Ct Abdomen Pelvis W Contrast  Result Date: 12/19/2016 CLINICAL DATA:  Left lower quadrant abdominal pain, nausea and vomiting. EXAM: CT ABDOMEN AND PELVIS WITH CONTRAST TECHNIQUE: Multidetector CT imaging of the abdomen and pelvis was performed using the standard protocol following bolus administration of intravenous contrast. CONTRAST:  ISOVUE-300 IOPAMIDOL (ISOVUE-300) INJECTION 61% COMPARISON:  None. FINDINGS: Lower chest: Tiny inferior right upper lobe calcified granuloma on image number 1 of series 3. There is also a tiny calcified granuloma in the medial aspect of the right lower lobe on image number 22 of series 3. Hepatobiliary: No focal liver abnormality is seen. No gallstones, gallbladder wall thickening, or biliary dilatation. Pancreas: Unremarkable. No pancreatic ductal dilatation or surrounding inflammatory changes. Spleen: Normal in size without focal abnormality. Adrenals/Urinary Tract: Prominent extrarenal pelvis on the left. Otherwise, normal appearing kidneys, ureters and urinary bladder. Normal appearing adrenal glands. Stomach/Bowel: Multiple dilated small bowel loops within and proximal to an area of swirling mesenteric with mesenteric edema. There are areas of focal narrowing of these loops with a pinched appearance. No bowel wall thickening or pneumatosis demonstrated. No free peritoneal air. There is a small amount of free peritoneal fluid. Vascular/Lymphatic: Swirling mesenteric vessels and fat  in the left lower abdomen in the area of dilated bowel loops. No areas of vascular occlusion are seen. No aneurysm. No enlarged lymph nodes. Reproductive: Mildly enlarged prostate gland with central calcification. Other: Small amount of free peritoneal fluid. Musculoskeletal: Mild lumbar and lower thoracic spine degenerative changes, greatest at the L4-5 level. IMPRESSION: Closed loop small-bowel obstruction  in the left lower abdomen with mesenteric edema and swirling. This is compatible with an internal hernia or twisting of the mesentery. No evidence of intestinal ischemia at this time. There is an associated small amount of free peritoneal fluid. These results were called by telephone at the time of interpretation on 12/19/2016 at 7:58 pm to Dr. Pricilla LovelessSCOTT GOLDSTON , who verbally acknowledged these results. Electronically Signed   By: Beckie SaltsSteven  Reid M.D.   On: 12/19/2016 20:00   Dg Abd 2 Views  Result Date: 12/20/2016 CLINICAL DATA:  Small bowel obstruction. EXAM: ABDOMEN - 2 VIEW COMPARISON:  CT abdomen and pelvis 12/19/2016. FINDINGS: No free intraperitoneal air is identified. There has been increase in gaseous distention of a small bowel loop in the central abdomen measuring 5.3 cm transverse. Gas and stool are again seen in the colon. IMPRESSION: Increased gaseous distention of small bowel loop in the central abdomen compatible with worsened small-bowel obstruction. Electronically Signed   By: Drusilla Kannerhomas  Dalessio M.D.   On: 12/20/2016 09:49   Dg Abd Portable 1v-small Bowel Obstruction Protocol-initial, 8 Hr Delay  Result Date: 12/21/2016 CLINICAL DATA:  Small-bowel obstruction. Evaluate oral contrast movement. EXAM: PORTABLE ABDOMEN - 1 VIEW COMPARISON:  12/20/2016.  CT 12/19/2016. FINDINGS: NG tube noted with its tip in the distal stomach. Oral contrast noted primarily throughout the colon. No evidence of bowel obstruction. No evidence of free air. No acute bony abnormality . IMPRESSION: 1. NG tube noted with its tip in the distal stomach. 2. Oral contrast noted throughout the colon. No evidence of bowel obstruction on today's exam. Electronically Signed   By: Maisie Fushomas  Register   On: 12/21/2016 06:19   Dg Abd Portable 1v-small Bowel Protocol-position Verification  Result Date: 12/20/2016 CLINICAL DATA:  Nasogastric tube placement EXAM: PORTABLE ABDOMEN - 1 VIEW COMPARISON:  Earlier today FINDINGS: New  nasogastric tube with tip and side-port over the stomach. Under appreciated small bowel obstruction when compared to CT due to fluid filling. No evidence of pneumoperitoneum or pneumatosis. Lung bases are clear. IMPRESSION: Nasogastric tube in good position with tip and side-port over the stomach. Electronically Signed   By: Marnee SpringJonathon  Watts M.D.   On: 12/20/2016 13:17    Anti-infectives: Anti-infectives    None     Assessment/Plan  SBO - Past medical history of open appendectomy - CT scan performed 7/16 suggests mesenteric swirling in the left lower quadrant with proximally dilated loops of small bowel. - SB protocol 7/17 >> contrast in colon; D/C NGT and start clears - mobilize, IS  Elevated lactic acid- normalized with IVF rehydration FEN - clears, advance to SOFT as tolerated  ID- none; leukocytosis resolved VT-Lovenox, SCDs   LOS: 1 day      Adam PhenixElizabeth S Simaan , [facility] 12/21/2016, 9:07 AM Pager: [phone] Consults: [phone] Mon-Fri 7:00 am-4:30 pm Sat-Sun 7:00 am-11:30 am

## 2016-12-22 NOTE — Discharge Instructions (Signed)
Small Bowel Obstruction °A small bowel obstruction means that something is blocking the small bowel. The small bowel is also called the small intestine. It is the long tube that connects the stomach to the colon. An obstruction will stop food and fluids from passing through the small bowel. Treatment depends on what is causing the problem and how bad the problem is. °Follow these instructions at home: °· Get a lot of rest. °· Follow your diet as told by your doctor. You may need to: °? Only drink clear liquids until you start to get better. °? Avoid solid foods as told by your doctor. °· Take over-the-counter and prescription medicines only as told by your doctor. °· Keep all follow-up visits as told by your doctor. This is important. °Contact a doctor if: °· You have a fever. °· You have chills. °Get help right away if: °· You have pain or cramps that get worse. °· You throw up (vomit) blood. °· You have a feeling of being sick to your stomach (nausea) that does not go away. °· You cannot stop throwing up. °· You cannot drink fluids. °· You feel confused. °· You feel dry or thirsty (dehydrated). °· Your belly gets more bloated. °· You feel weak or you pass out (faint). °This information is not intended to replace advice given to you by your health care provider. Make sure you discuss any questions you have with your health care provider. °Document Released: 06/30/2004 Document Revised: 01/18/2016 Document Reviewed: 07/17/2014 °Elsevier Interactive Patient Education © 2018 [employer] ° °

## 2016-12-22 NOTE — Discharge Planning (Signed)
Patient IV removed. Discharge papers given, explained and educated.  No further FU appts needed at this time - patient instructed to contact PCP as needed.  No needed scripts at this time.  Patient not in any pain at discharge.  RN assessment and VS revealed stability for DC to home.  Once ready, patient will be wheeled to front and family transporting home via car.

## 2016-12-22 NOTE — Discharge Summary (Signed)
[facility] Discharge Summary   Patient ID: Ronald Frazier MRN: [id number] DOB/AGE: July 10, 1970 46 y.o.  Admit date: 12/19/2016 Discharge date: 12/22/2016  Admitting Diagnosis: SBO  Discharge Diagnosis Patient Active Problem List   Diagnosis Date Noted  . Small bowel obstruction due to adhesions (HCC) 12/19/2016  . Hx of appendectomy 12/19/2016    Consultants None  Imaging: Dg Abd 2 Views  Result Date: 12/20/2016 CLINICAL DATA:  Small bowel obstruction. EXAM: ABDOMEN - 2 VIEW COMPARISON:  CT abdomen and pelvis 12/19/2016. FINDINGS: No free intraperitoneal air is identified. There has been increase in gaseous distention of a small bowel loop in the central abdomen measuring 5.3 cm transverse. Gas and stool are again seen in the colon. IMPRESSION: Increased gaseous distention of small bowel loop in the central abdomen compatible with worsened small-bowel obstruction. Electronically Signed   By: Drusilla Kanner M.D.   On: 12/20/2016 09:49   Dg Abd Portable 1v-small Bowel Obstruction Protocol-initial, 8 Hr Delay  Result Date: 12/21/2016 CLINICAL DATA:  Small-bowel obstruction. Evaluate oral contrast movement. EXAM: PORTABLE ABDOMEN - 1 VIEW COMPARISON:  12/20/2016.  CT 12/19/2016. FINDINGS: NG tube noted with its tip in the distal stomach. Oral contrast noted primarily throughout the colon. No evidence of bowel obstruction. No evidence of free air. No acute bony abnormality . IMPRESSION: 1. NG tube noted with its tip in the distal stomach. 2. Oral contrast noted throughout the colon. No evidence of bowel obstruction on today's exam. Electronically Signed   By: Maisie Fus  Register   On: 12/21/2016 06:19   Dg Abd Portable 1v-small Bowel Protocol-position Verification  Result Date: 12/20/2016 CLINICAL DATA:  Nasogastric tube placement EXAM: PORTABLE ABDOMEN - 1 VIEW COMPARISON:  Earlier today FINDINGS: New nasogastric tube with tip and side-port over the stomach. Under  appreciated small bowel obstruction when compared to CT due to fluid filling. No evidence of pneumoperitoneum or pneumatosis. Lung bases are clear. IMPRESSION: Nasogastric tube in good position with tip and side-port over the stomach. Electronically Signed   By: Marnee Spring M.D.   On: 12/20/2016 13:17    Procedures None  Hospital Course:  Ronald Frazier is a 46yo male who presented to [facility] 12/19/16 with acute onset abdominal pain, nausea, and vomiting.  Workup showed SBO, likely secondary to adhesions from a prior appendectomy.  Patient was admitted for bowel rest and IV hydration. He was started on the small bowel protocol therefore NG tube was placed and serial abdominal xrays were performed with contrast. On 7/18 follow-up xray showed contrast into the colon, and patient's bowel function returned. NG tube was removed and diet slowly advanced as tolerated. On 12/22/16 the patient was voiding well, tolerating diet, ambulating well, pain well controlled, vital signs stable and felt stable for discharge home.  Patient will not need any surgical follow-up, and may follow-up with his PCP as needed. Discussed importance of regular exercise and avoiding constipation.   Physical Exam: General:  Alert, NAD, pleasant, comfortable Pulm: effort normal, CTAB Cardio: RRR Abd:  Soft, ND/NT, +BS in all 4 quadrants, no HSM or hernia Skin: warm and dry, no rashes  Psych: A&Ox3   Allergies as of 12/22/2016   No Known Allergies     Medication List    TAKE these medications   fexofenadine 30 MG tablet Commonly known as:  ALLEGRA Take 30 mg by mouth daily as needed (allergies).   ibuprofen 200 MG tablet Commonly known as:  ADVIL,MOTRIN Take 200 mg by mouth every 6 (six) hours  as needed for moderate pain.        Follow-up Information    Primary care physician Follow up.   Why:  As needed          Signed: Edson SnowballBROOKE A MILLER, [facility] 12/22/2016, 9:39 AM Pager:  [phone] Consults: [phone] Mon-Fri 7:00 am-4:30 pm Sat-Sun 7:00 am-11:30 am

## 2019-02-17 IMAGING — DX DG ABDOMEN 2V
3 series · 3 of 3 positions shown · non-contrast
Comparison: CT abdomen and pelvis 12/19/2016.

CLINICAL DATA: Small bowel obstruction.

EXAM:
ABDOMEN - 2 VIEW

[abdomen erect]
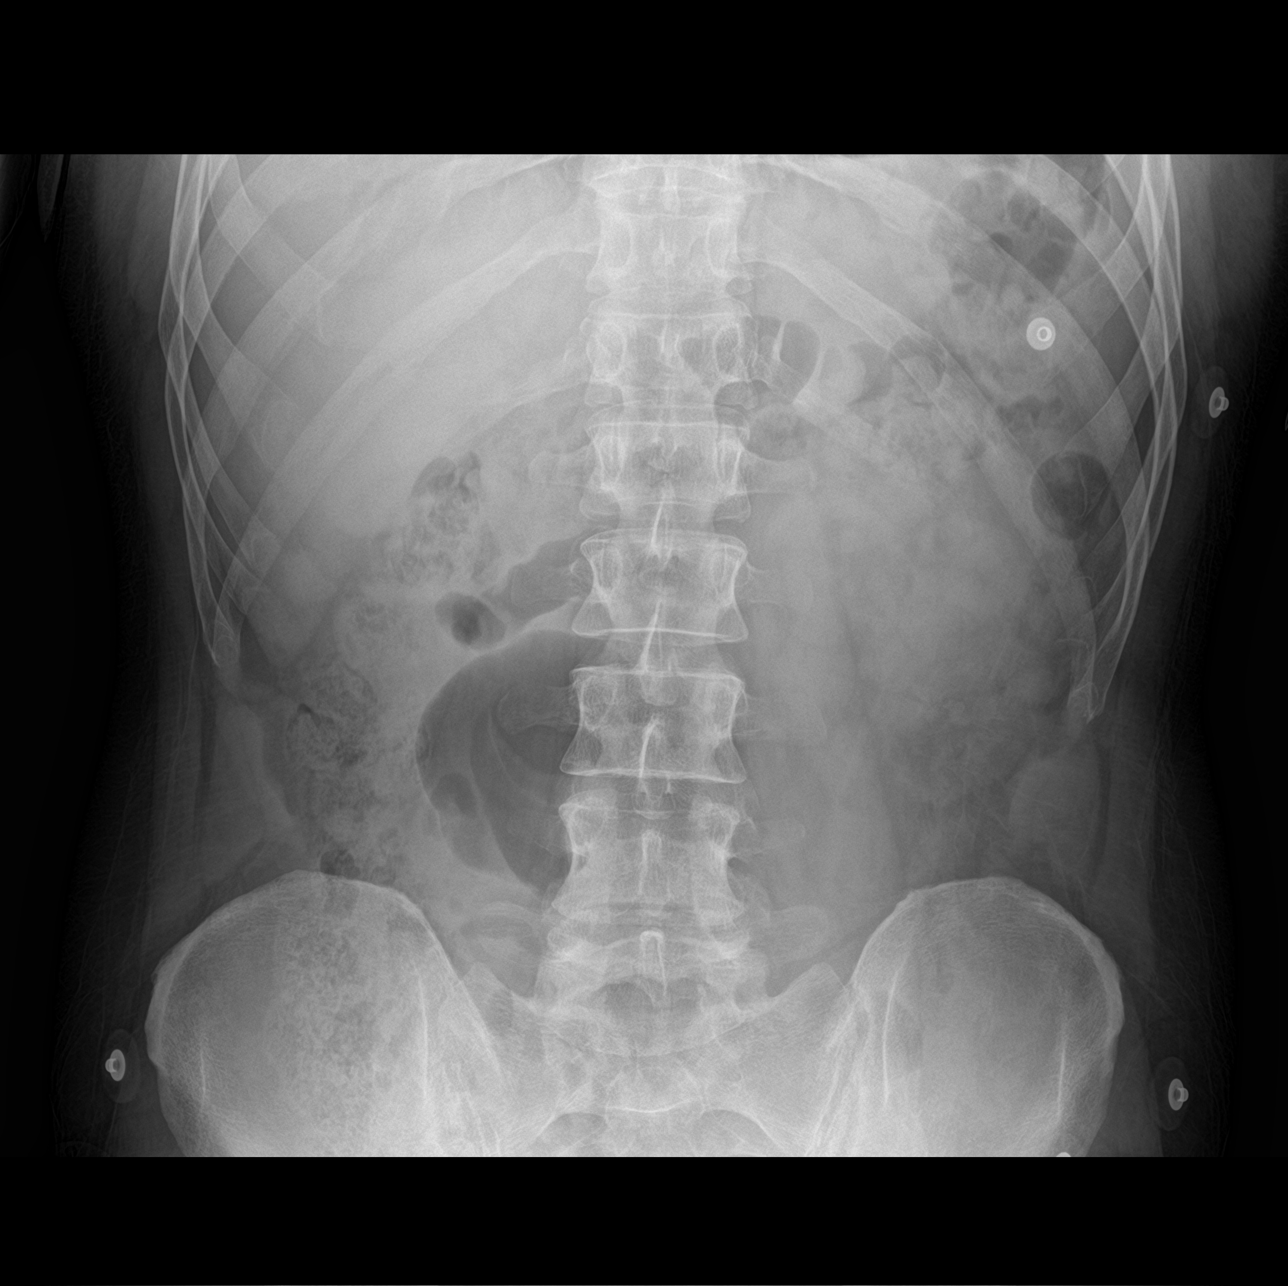

[abdomen supine (1 of 2)]
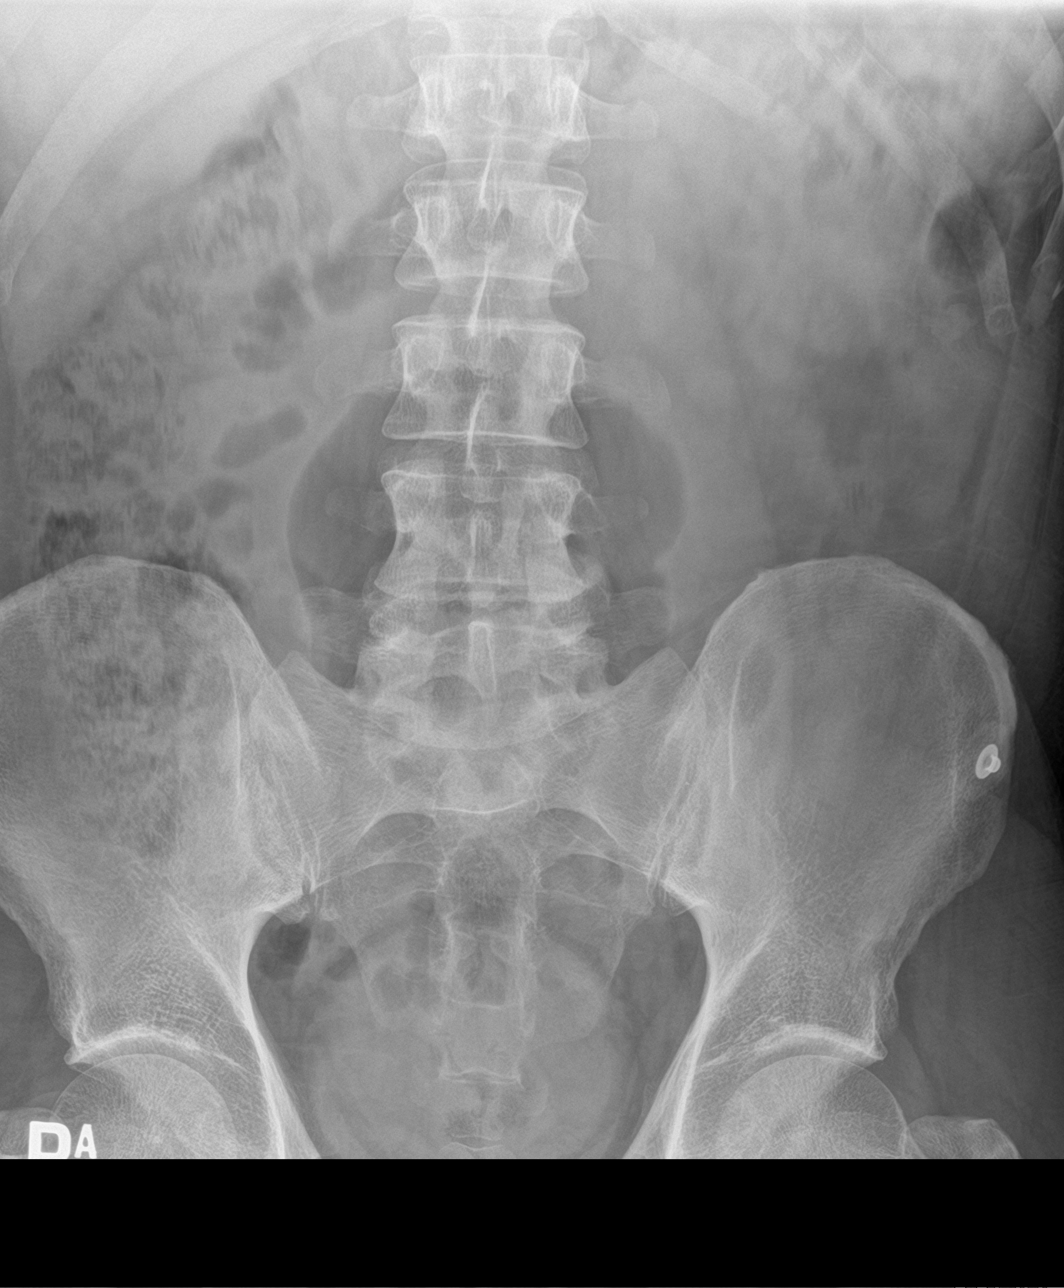

[abdomen supine (2 of 2)]
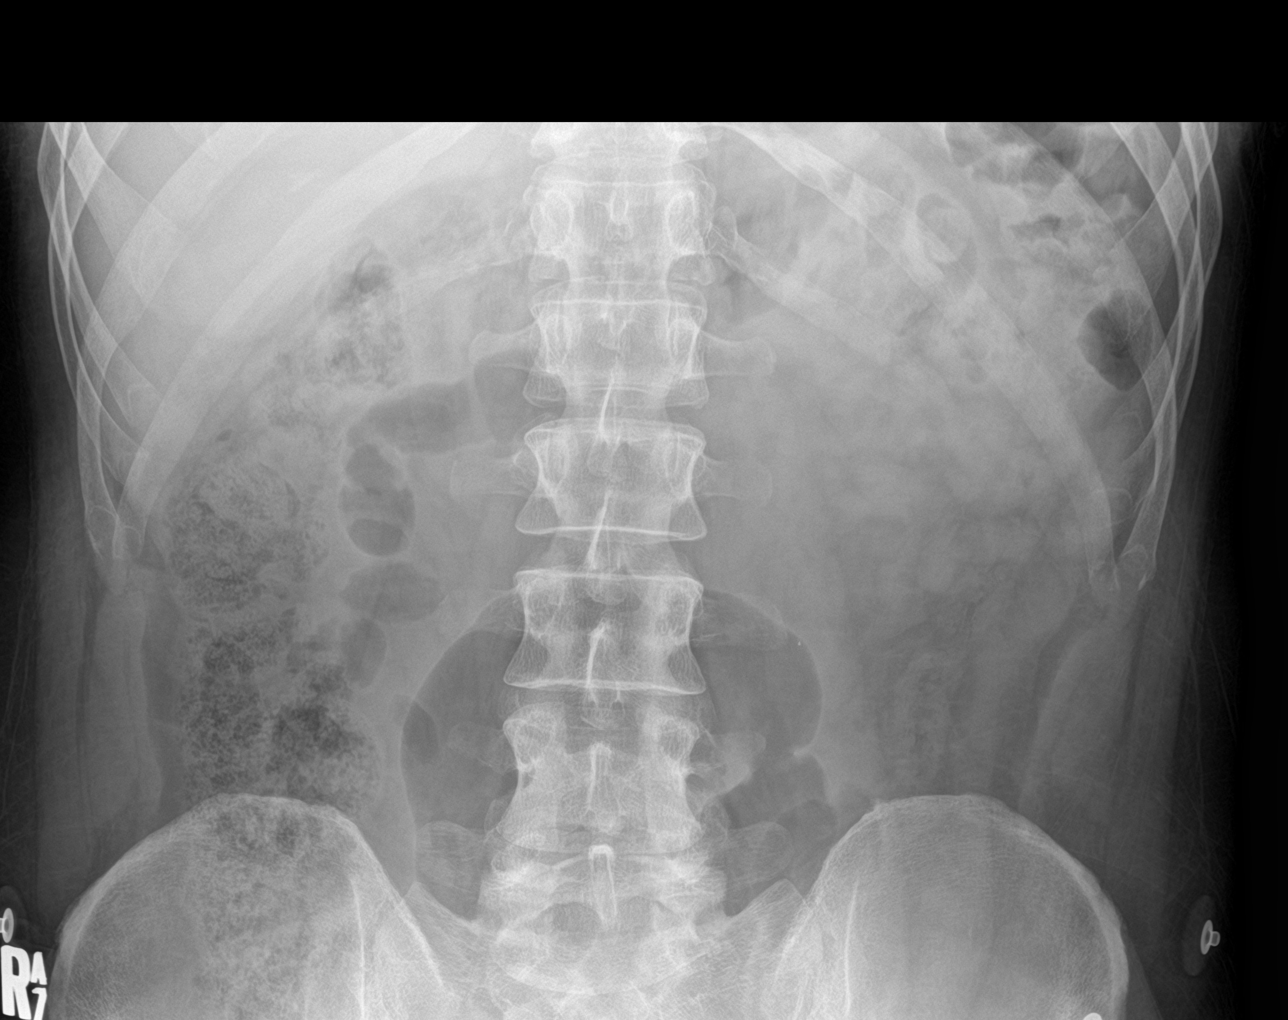

[3 of 3 positions shown; findings below may reference images not displayed]

FINDINGS: No free intraperitoneal air is identified. There has been increase
in gaseous distention of a small bowel loop in the central abdomen
measuring 5.3 cm transverse. Gas and stool are again seen in the
colon.
IMPRESSION: Increased gaseous distention of small bowel loop in the central
abdomen compatible with worsened small-bowel obstruction.
